# Patient Record
Sex: Female | Born: 1950
Health system: Southern US, Community
[De-identification: ages and names within clinical notes are randomized; demographics above are authoritative.]

## PROBLEM LIST (undated history)

## (undated) DIAGNOSIS — I10 Essential (primary) hypertension: Secondary | ICD-10-CM

## (undated) DIAGNOSIS — N189 Chronic kidney disease, unspecified: Secondary | ICD-10-CM

## (undated) DIAGNOSIS — K219 Gastro-esophageal reflux disease without esophagitis: Secondary | ICD-10-CM

## (undated) HISTORY — PX: TUBAL LIGATION: SHX77

## (undated) HISTORY — PX: OTHER SURGICAL HISTORY: SHX169

---

## 2017-09-22 DIAGNOSIS — Z23 Encounter for immunization: Secondary | ICD-10-CM | POA: Diagnosis not present

## 2017-09-22 DIAGNOSIS — N3281 Overactive bladder: Secondary | ICD-10-CM | POA: Diagnosis not present

## 2017-09-22 DIAGNOSIS — M545 Low back pain: Secondary | ICD-10-CM | POA: Diagnosis not present

## 2017-09-22 DIAGNOSIS — K219 Gastro-esophageal reflux disease without esophagitis: Secondary | ICD-10-CM | POA: Diagnosis not present

## 2017-09-22 DIAGNOSIS — N183 Chronic kidney disease, stage 3 (moderate): Secondary | ICD-10-CM | POA: Diagnosis not present

## 2017-09-22 DIAGNOSIS — M67449 Ganglion, unspecified hand: Secondary | ICD-10-CM | POA: Diagnosis not present

## 2017-09-22 DIAGNOSIS — I10 Essential (primary) hypertension: Secondary | ICD-10-CM | POA: Diagnosis not present

## 2017-10-08 DIAGNOSIS — M79644 Pain in right finger(s): Secondary | ICD-10-CM | POA: Diagnosis not present

## 2017-10-08 DIAGNOSIS — M67441 Ganglion, right hand: Secondary | ICD-10-CM | POA: Diagnosis not present

## 2017-10-08 DIAGNOSIS — M19041 Primary osteoarthritis, right hand: Secondary | ICD-10-CM | POA: Diagnosis not present

## 2018-01-06 ENCOUNTER — Other Ambulatory Visit: Payer: Self-pay | Admitting: Orthopedic Surgery

## 2018-01-14 ENCOUNTER — Other Ambulatory Visit: Payer: Self-pay

## 2018-01-14 ENCOUNTER — Encounter (HOSPITAL_BASED_OUTPATIENT_CLINIC_OR_DEPARTMENT_OTHER): Payer: Self-pay | Admitting: *Deleted

## 2018-01-14 NOTE — Progress Notes (Signed)
Pt is coming Friday for BMET and EKG. Bring all medications.

## 2018-01-16 ENCOUNTER — Encounter (HOSPITAL_BASED_OUTPATIENT_CLINIC_OR_DEPARTMENT_OTHER)
Admission: RE | Admit: 2018-01-16 | Discharge: 2018-01-16 | Disposition: A | Discharge status: Home / Self Care | Payer: Medicare Other | Source: Ambulatory Visit | Attending: Orthopedic Surgery | Admitting: Orthopedic Surgery

## 2018-01-16 ENCOUNTER — Other Ambulatory Visit: Payer: Self-pay

## 2018-01-16 DIAGNOSIS — Z01818 Encounter for other preprocedural examination: Secondary | ICD-10-CM | POA: Diagnosis not present

## 2018-01-16 DIAGNOSIS — I10 Essential (primary) hypertension: Secondary | ICD-10-CM | POA: Insufficient documentation

## 2018-01-16 LAB — BASIC METABOLIC PANEL
ANION GAP: 10 (ref 5–15)
BUN: 14 mg/dL (ref 6–20)
CHLORIDE: 105 mmol/L (ref 101–111)
CO2: 23 mmol/L (ref 22–32)
CREATININE: 1.27 mg/dL — AB (ref 0.44–1.00)
Calcium: 9.3 mg/dL (ref 8.9–10.3)
GFR calc non Af Amer: 43 mL/min — ABNORMAL LOW (ref >60)
GFR, EST AFRICAN AMERICAN: 50 mL/min — AB (ref >60)
GLUCOSE: 100 mg/dL — AB (ref 65–99)
Potassium: 4 mmol/L (ref 3.5–5.1)
Sodium: 138 mmol/L (ref 135–145)

## 2018-01-22 ENCOUNTER — Ambulatory Visit (HOSPITAL_BASED_OUTPATIENT_CLINIC_OR_DEPARTMENT_OTHER): Payer: Medicare Other | Admitting: Anesthesiology

## 2018-01-22 ENCOUNTER — Encounter (HOSPITAL_BASED_OUTPATIENT_CLINIC_OR_DEPARTMENT_OTHER): Payer: Self-pay | Admitting: Orthopedic Surgery

## 2018-01-22 ENCOUNTER — Encounter (HOSPITAL_BASED_OUTPATIENT_CLINIC_OR_DEPARTMENT_OTHER)
Admission: RE | Disposition: A | Discharge status: Home / Self Care | Payer: Self-pay | Source: Ambulatory Visit | Attending: Orthopedic Surgery

## 2018-01-22 ENCOUNTER — Ambulatory Visit (HOSPITAL_BASED_OUTPATIENT_CLINIC_OR_DEPARTMENT_OTHER)
Admission: RE | Admit: 2018-01-22 | Discharge: 2018-01-22 | Disposition: A | Discharge status: Home / Self Care | Payer: Medicare Other | Source: Ambulatory Visit | Attending: Orthopedic Surgery | Admitting: Orthopedic Surgery

## 2018-01-22 ENCOUNTER — Other Ambulatory Visit: Payer: Self-pay

## 2018-01-22 DIAGNOSIS — I129 Hypertensive chronic kidney disease with stage 1 through stage 4 chronic kidney disease, or unspecified chronic kidney disease: Secondary | ICD-10-CM | POA: Insufficient documentation

## 2018-01-22 DIAGNOSIS — N189 Chronic kidney disease, unspecified: Secondary | ICD-10-CM | POA: Insufficient documentation

## 2018-01-22 DIAGNOSIS — M25741 Osteophyte, right hand: Secondary | ICD-10-CM | POA: Insufficient documentation

## 2018-01-22 DIAGNOSIS — M67441 Ganglion, right hand: Secondary | ICD-10-CM | POA: Insufficient documentation

## 2018-01-22 DIAGNOSIS — M19041 Primary osteoarthritis, right hand: Secondary | ICD-10-CM | POA: Diagnosis not present

## 2018-01-22 DIAGNOSIS — M6740 Ganglion, unspecified site: Secondary | ICD-10-CM | POA: Diagnosis not present

## 2018-01-22 HISTORY — DX: Essential (primary) hypertension: I10

## 2018-01-22 HISTORY — DX: Chronic kidney disease, unspecified: N18.9

## 2018-01-22 HISTORY — PX: MASS EXCISION: SHX2000

## 2018-01-22 HISTORY — DX: Gastro-esophageal reflux disease without esophagitis: K21.9

## 2018-01-22 SURGERY — EXCISION MASS
Anesthesia: Regional | Site: Thumb | Laterality: Right

## 2018-01-22 MED ORDER — FENTANYL CITRATE (PF) 100 MCG/2ML IJ SOLN
INTRAMUSCULAR | Status: AC
  Filled 2018-01-22: qty 2

## 2018-01-22 MED ORDER — SCOPOLAMINE 1 MG/3DAYS TD PT72: 1.0000 | MEDICATED_PATCH | Freq: Once | TRANSDERMAL | Status: DC | PRN

## 2018-01-22 MED ORDER — ONDANSETRON HCL 4 MG/2ML IJ SOLN
INTRAMUSCULAR | Status: DC | PRN
  Administered 2018-01-22: 4 mg via INTRAVENOUS

## 2018-01-22 MED ORDER — CHLORHEXIDINE GLUCONATE 4 % EX LIQD: 60.0000 mL | Freq: Once | CUTANEOUS | Status: DC

## 2018-01-22 MED ORDER — PROPOFOL 10 MG/ML IV BOLUS
INTRAVENOUS | Status: DC | PRN
  Administered 2018-01-22: 170 mg via INTRAVENOUS

## 2018-01-22 MED ORDER — HYDROMORPHONE HCL 1 MG/ML IJ SOLN: 0.2500 mg | INTRAMUSCULAR | Status: DC | PRN

## 2018-01-22 MED ORDER — FENTANYL CITRATE (PF) 100 MCG/2ML IJ SOLN
INTRAMUSCULAR | Status: DC | PRN
  Administered 2018-01-22: 50 ug via INTRAVENOUS

## 2018-01-22 MED ORDER — EPHEDRINE SULFATE-NACL 50-0.9 MG/10ML-% IV SOSY
PREFILLED_SYRINGE | INTRAVENOUS | Status: DC | PRN
  Administered 2018-01-22: 10 mg via INTRAVENOUS

## 2018-01-22 MED ORDER — HYDROCODONE-ACETAMINOPHEN 7.5-325 MG PO TABS: 1.0000 | ORAL_TABLET | Freq: Once | ORAL | Status: DC | PRN

## 2018-01-22 MED ORDER — HYDROCODONE-ACETAMINOPHEN 5-325 MG PO TABS: ORAL_TABLET | ORAL | 0 refills | Status: AC

## 2018-01-22 MED ORDER — MEPERIDINE HCL 25 MG/ML IJ SOLN: 6.2500 mg | INTRAMUSCULAR | Status: DC | PRN

## 2018-01-22 MED ORDER — CEFAZOLIN SODIUM-DEXTROSE 2-4 GM/100ML-% IV SOLN
2.0000 g | INTRAVENOUS | Status: AC
  Administered 2018-01-22: 2 g via INTRAVENOUS

## 2018-01-22 MED ORDER — FENTANYL CITRATE (PF) 100 MCG/2ML IJ SOLN: 50.0000 ug | INTRAMUSCULAR | Status: DC | PRN

## 2018-01-22 MED ORDER — ACETAMINOPHEN 10 MG/ML IV SOLN: 1000.0000 mg | Freq: Once | INTRAVENOUS | Status: DC | PRN

## 2018-01-22 MED ORDER — MIDAZOLAM HCL 2 MG/2ML IJ SOLN
INTRAMUSCULAR | Status: AC
Start: 2018-01-22 — End: 2018-01-22
  Filled 2018-01-22: qty 2

## 2018-01-22 MED ORDER — MIDAZOLAM HCL 2 MG/2ML IJ SOLN: 1.0000 mg | INTRAMUSCULAR | Status: DC | PRN

## 2018-01-22 MED ORDER — LIDOCAINE 2% (20 MG/ML) 5 ML SYRINGE
INTRAMUSCULAR | Status: DC | PRN
Start: 2018-01-22 — End: 2018-01-22
  Administered 2018-01-22: 80 mg via INTRAVENOUS

## 2018-01-22 MED ORDER — PROMETHAZINE HCL 25 MG/ML IJ SOLN: 6.2500 mg | INTRAMUSCULAR | Status: DC | PRN

## 2018-01-22 MED ORDER — BUPIVACAINE HCL (PF) 0.25 % IJ SOLN
INTRAMUSCULAR | Status: DC | PRN
  Administered 2018-01-22: 5 mL

## 2018-01-22 MED ORDER — LACTATED RINGERS IV SOLN
INTRAVENOUS | Status: DC
  Administered 2018-01-22: 12:00:00 via INTRAVENOUS

## 2018-01-22 MED ORDER — CEFAZOLIN SODIUM-DEXTROSE 2-4 GM/100ML-% IV SOLN
INTRAVENOUS | Status: AC
  Filled 2018-01-22: qty 100

## 2018-01-22 MED ORDER — DEXAMETHASONE SODIUM PHOSPHATE 10 MG/ML IJ SOLN
INTRAMUSCULAR | Status: DC | PRN
  Administered 2018-01-22: 10 mg via INTRAVENOUS

## 2018-01-22 MED ORDER — MIDAZOLAM HCL 5 MG/5ML IJ SOLN
INTRAMUSCULAR | Status: DC | PRN
  Administered 2018-01-22: 2 mg via INTRAVENOUS

## 2018-01-22 MED ORDER — PROPOFOL 10 MG/ML IV BOLUS
INTRAVENOUS | Status: AC
  Filled 2018-01-22: qty 20

## 2018-01-22 SURGICAL SUPPLY — 50 items
BANDAGE ACE 3X5.8 VEL STRL LF (GAUZE/BANDAGES/DRESSINGS) IMPLANT
BANDAGE COBAN STERILE 2 (GAUZE/BANDAGES/DRESSINGS) IMPLANT
BENZOIN TINCTURE PRP APPL 2/3 (GAUZE/BANDAGES/DRESSINGS) IMPLANT
BLADE MINI RND TIP GREEN BEAV (BLADE) IMPLANT
BLADE SURG 15 STRL LF DISP TIS (BLADE) ×2 IMPLANT
BLADE SURG 15 STRL SS (BLADE) ×4
BNDG COHESIVE 1X5 TAN STRL LF (GAUZE/BANDAGES/DRESSINGS) IMPLANT
BNDG CONFORM 2 STRL LF (GAUZE/BANDAGES/DRESSINGS) IMPLANT
BNDG ELASTIC 2X5.8 VLCR STR LF (GAUZE/BANDAGES/DRESSINGS) IMPLANT
BNDG ESMARK 4X9 LF (GAUZE/BANDAGES/DRESSINGS) ×3 IMPLANT
BNDG GAUZE 1X2.1 STRL (MISCELLANEOUS) IMPLANT
BNDG GAUZE ELAST 4 BULKY (GAUZE/BANDAGES/DRESSINGS) IMPLANT
BNDG PLASTER X FAST 3X3 WHT LF (CAST SUPPLIES) IMPLANT
CHLORAPREP W/TINT 26ML (MISCELLANEOUS) ×3 IMPLANT
CLOSURE WOUND 1/2 X4 (GAUZE/BANDAGES/DRESSINGS)
CORD BIPOLAR FORCEPS 12FT (ELECTRODE) ×3 IMPLANT
COVER BACK TABLE 60X90IN (DRAPES) ×3 IMPLANT
COVER MAYO STAND STRL (DRAPES) ×3 IMPLANT
CUFF TOURNIQUET SINGLE 18IN (TOURNIQUET CUFF) ×3 IMPLANT
DRAPE EXTREMITY T 121X128X90 (DRAPE) ×3 IMPLANT
DRAPE SURG 17X23 STRL (DRAPES) ×3 IMPLANT
GAUZE SPONGE 4X4 12PLY STRL (GAUZE/BANDAGES/DRESSINGS) ×3 IMPLANT
GAUZE XEROFORM 1X8 LF (GAUZE/BANDAGES/DRESSINGS) ×3 IMPLANT
GLOVE BIO SURGEON STRL SZ7.5 (GLOVE) ×3 IMPLANT
GLOVE BIOGEL PI IND STRL 8 (GLOVE) ×1 IMPLANT
GLOVE BIOGEL PI INDICATOR 8 (GLOVE) ×2
GOWN STRL REUS W/ TWL LRG LVL3 (GOWN DISPOSABLE) ×1 IMPLANT
GOWN STRL REUS W/TWL LRG LVL3 (GOWN DISPOSABLE) ×2
GOWN STRL REUS W/TWL XL LVL3 (GOWN DISPOSABLE) ×3 IMPLANT
NEEDLE HYPO 25X1 1.5 SAFETY (NEEDLE) ×3 IMPLANT
NS IRRIG 1000ML POUR BTL (IV SOLUTION) ×3 IMPLANT
PACK BASIN DAY SURGERY FS (CUSTOM PROCEDURE TRAY) ×3 IMPLANT
PAD CAST 3X4 CTTN HI CHSV (CAST SUPPLIES) IMPLANT
PAD CAST 4YDX4 CTTN HI CHSV (CAST SUPPLIES) IMPLANT
PADDING CAST ABS 4INX4YD NS (CAST SUPPLIES) ×2
PADDING CAST ABS COTTON 4X4 ST (CAST SUPPLIES) ×1 IMPLANT
PADDING CAST COTTON 3X4 STRL (CAST SUPPLIES)
PADDING CAST COTTON 4X4 STRL (CAST SUPPLIES)
SPLINT FINGER 3.25 BULB 911905 (SOFTGOODS) ×3 IMPLANT
STOCKINETTE 4X48 STRL (DRAPES) ×3 IMPLANT
STRIP CLOSURE SKIN 1/2X4 (GAUZE/BANDAGES/DRESSINGS) IMPLANT
SUT ETHILON 3 0 PS 1 (SUTURE) IMPLANT
SUT ETHILON 4 0 PS 2 18 (SUTURE) ×3 IMPLANT
SUT ETHILON 5 0 P 3 18 (SUTURE)
SUT NYLON ETHILON 5-0 P-3 1X18 (SUTURE) IMPLANT
SUT VIC AB 4-0 P2 18 (SUTURE) IMPLANT
SYR BULB 3OZ (MISCELLANEOUS) ×3 IMPLANT
SYR CONTROL 10ML LL (SYRINGE) ×3 IMPLANT
TOWEL OR 17X24 6PK STRL BLUE (TOWEL DISPOSABLE) ×6 IMPLANT
UNDERPAD 30X30 (UNDERPADS AND DIAPERS) ×3 IMPLANT

## 2018-01-22 NOTE — Transfer of Care (Signed)
Immediate Anesthesia Transfer of Care Note  Patient: Caroline Best  Procedure(s) Performed: EXCISION MUCOID CYST RIGHT THUMB (Right Thumb)  Patient Location: PACU  Anesthesia Type:General  Level of Consciousness: awake, alert  and oriented  Airway & Oxygen Therapy: Patient Spontanous Breathing and Patient connected to face mask oxygen  Post-op Assessment: Report given to RN and Post -op Vital signs reviewed and stable  Post vital signs: Reviewed and stable  Last Vitals:  Vitals Value Taken Time  BP 106/65 01/22/2018  1:45 PM  Temp    Pulse 89 01/22/2018  1:46 PM  Resp 17 01/22/2018  1:46 PM  SpO2 99 % 01/22/2018  1:46 PM  Vitals shown include unvalidated device data.  Last Pain:  Vitals:   01/22/18 1122  TempSrc: Oral         Complications: No apparent anesthesia complications

## 2018-01-22 NOTE — Anesthesia Preprocedure Evaluation (Addendum)
Anesthesia Evaluation  Patient identified by MRN, date of birth, ID band Patient awake    Reviewed: Allergy & Precautions, NPO status , Patient's Chart, lab work & pertinent test results  Airway Mallampati: II  TM Distance: >3 FB Neck ROM: Full    Dental no notable dental hx.    Pulmonary neg pulmonary ROS,    Pulmonary exam normal breath sounds clear to auscultation       Cardiovascular hypertension, Normal cardiovascular exam Rhythm:Regular Rate:Normal     Neuro/Psych negative neurological ROS     GI/Hepatic Neg liver ROS,   Endo/Other    Renal/GU      Musculoskeletal   Abdominal (+) + obese,   Peds  Hematology   Anesthesia Other Findings   Reproductive/Obstetrics                            Anesthesia Physical Anesthesia Plan  ASA: II  Anesthesia Plan: General   Post-op Pain Management: GA combined w/ Regional for post-op pain   Induction: Intravenous  PONV Risk Score and Plan: 3 and Treatment may vary due to age or medical condition, Dexamethasone and Ondansetron  Airway Management Planned: LMA  Additional Equipment:   Intra-op Plan:   Post-operative Plan:   Informed Consent: I have reviewed the patients History and Physical, chart, labs and discussed the procedure including the risks, benefits and alternatives for the proposed anesthesia with the patient or authorized representative who has indicated his/her understanding and acceptance.     Plan Discussed with: CRNA  Anesthesia Plan Comments:        Anesthesia Quick Evaluation

## 2018-01-22 NOTE — Brief Op Note (Signed)
01/22/2018  1:42 PM  PATIENT:  Caroline EwingsLillie Best  67 y.o. female  PRE-OPERATIVE DIAGNOSIS:  right thumb mucoid cyst M67.40  POST-OPERATIVE DIAGNOSIS:  right thumb mucoid cyst M67.40  PROCEDURE:  Procedure(s): EXCISION MUCOID CYST RIGHT THUMB (Right)  SURGEON:  Surgeon(s) and Role:    Betha Loa* Chace Bisch, MD - Primary  PHYSICIAN ASSISTANT:   ASSISTANTS: none   ANESTHESIA:   general  EBL:  10 mL   BLOOD ADMINISTERED:none  DRAINS: none   LOCAL MEDICATIONS USED:  MARCAINE     SPECIMEN:  Source of Specimen:  right thumb  DISPOSITION OF SPECIMEN:  PATHOLOGY  COUNTS:  YES  TOURNIQUET:   Total Tourniquet Time Documented: Upper Arm (Right) - 20 minutes Total: Upper Arm (Right) - 20 minutes   DICTATION: .Other Dictation: Dictation Number [id number]  PLAN OF CARE: Discharge to home after PACU  PATIENT DISPOSITION:  PACU - hemodynamically stable.

## 2018-01-22 NOTE — H&P (Signed)
  Caroline EwingsLillie Best is an 67 y.o. female.   Chief Complaint: right thumb mucoid cyst HPI: 67 yo female with mass right thumb x 4 weeks.  It is bothersome to her.  She wishes to have it removed and the ip joint debrided.  Allergies: No Known Allergies  Past Medical History:  Diagnosis Date  . Chronic kidney disease    urinary incontinence  . GERD (gastroesophageal reflux disease)   . Hypertension     Past Surgical History:  Procedure Laterality Date  . Removal of hernia disc and plate placed     in neck  . right foot surgery    . TUBAL LIGATION      Family History: No family history on file.  Social History:   reports that she has never smoked. She has never used smokeless tobacco. She reports that she drinks alcohol. She reports that she does not use drugs.  Medications: No medications prior to admission.    No results found for this or any previous visit (from the past 48 hour(s)).  No results found.   A comprehensive review of systems was negative.  Height 5\' 6"  (1.676 m), weight 102.1 kg (225 lb).  General appearance: alert, cooperative and appears stated age Head: Normocephalic, without obvious abnormality, atraumatic Neck: supple, symmetrical, trachea midline Cardio: regular rate and rhythm Resp: clear to auscultation bilaterally Extremities: Intact sensation and capillary refill all digits.  +epl/fpl/io.  No wounds.  Pulses: 2+ and symmetric Skin: Skin color, texture, turgor normal. No rashes or lesions Neurologic: Grossly normal Incision/Wound: none  Assessment/Plan Right thumb mucoid cyst.  Non operative and operative treatment options were discussed with the patient and patient wishes to proceed with operative treatment. Risks, benefits, and alternatives of surgery were discussed and the patient agrees with the plan of care.   Maurizio Geno R 01/22/2018, 10:12 AM

## 2018-01-22 NOTE — Discharge Instructions (Addendum)

## 2018-01-22 NOTE — Anesthesia Postprocedure Evaluation (Signed)
Anesthesia Post Note  Patient: Caroline Best  Procedure(s) Performed: EXCISION MUCOID CYST RIGHT THUMB (Right Thumb)     Patient location during evaluation: PACU Anesthesia Type: General Level of consciousness: awake and alert Pain management: pain level controlled Vital Signs Assessment: post-procedure vital signs reviewed and stable Respiratory status: spontaneous breathing, nonlabored ventilation, respiratory function stable and patient connected to nasal cannula oxygen Cardiovascular status: blood pressure returned to baseline and stable Postop Assessment: no apparent nausea or vomiting Anesthetic complications: no    Last Vitals:  Vitals Value Taken Time  BP    Temp    Pulse    Resp    SpO2      Last Pain:  Vitals:   01/22/18 1122  TempSrc: Oral                 Trevor IhaStephen A Samin Milke

## 2018-01-22 NOTE — Op Note (Signed)
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  PROCEDURE:   1. Right thumb excision of mucoid cyst  2. Right thumb debridement of IP joint including removal of osteophytes from proximal phalanx.  SURGEON:  Caroline LoaKevin Caroline Eve, MD.  ASSISTANT:  None.  ANESTHESIA:  General.  IV FLUIDS:  Per anesthesia flow sheet.  ESTIMATED BLOOD LOSS:  Minimal.  COMPLICATIONS:  None.  SPECIMENS:  Mucoid cyst to Pathology.  TOURNIQUET TIME:  20 minutes.  DISPOSITION:  Stable to PACU.  INDICATIONS:  Caroline Best is a 67 year old female, who has noted a mass on the right thumb.  This is bothersome to her.  She wished to have it removed.  Risks, benefits, and alternatives of surgery were discussed including the risk of blood loss; infection; damage to nerves, vessels, tendons, ligaments, bone; failure of surgery; need for additional surgery; complications with wound healing; continued pain; recurrence of mass.  She voiced understanding of these risks and elected to proceed.  OPERATIVE COURSE:  After being identified preoperatively by myself, the patient and I agreed upon procedure and site of procedure.  Surgical site was marked.  Risks, benefits, and alternatives of surgery were reviewed and she wished to proceed.  Surgical consent had been signed. She was given IV Ancef as preoperative antibiotic prophylaxis.  She was transferred to the operating room and placed on operating room table in supine position with the right upper extremity on arm board.   General anesthesia was induced by anesthesiologist.  Right upper extremity was prepped and draped in normal sterile orthopedic fashion.  Surgical pause was performed between surgeons, Anesthesia, and operating room staff; and all were in agreement as to the patient, procedure, and site of procedure.  Tourniquet at the proximal aspect of the extremity was inflated to 250 mmHg after exsanguination of the limb with an Esmarch bandage.  A hockey stick-shaped incision was made at the dorsum of the IP joint of the right thumb.  This was carried into subcutaneous tissues by spreading technique.  The mass was easily identified.  It was cystic in nature.  It was filled with gelatinous fluid with some bloody tinge to it.  The cystic sac was removed and sent to Pathology for examination.  The joint was entered underneath the EPL tendon.  There was prominent osteophyte at the radial aspect dorsally on the proximal phalanx.  The rongeurs were used to debride the joint of any remaining cyst as well as the prominent osteophyte.  The wound was copiously irrigated with sterile saline and closed with 4-0 nylon in a horizontal mattress fashion.  A digital block was performed with 0.25% plain Marcaine to aid in postoperative analgesia.  The wound was dressed with sterile Xeroform, 4x4s, and wrapped with a Coban dressing lightly. Alumafoam splint was placed and wrapped lightly with Coban dressing. Tourniquet was deflated at 20 minutes.  Fingertips were pink with brisk capillary refill after deflation of tourniquet.  The operative drapes were broken down.  The patient was awoken from anesthesia safely.  She was transferred back to stretcher and taken to PACU in stable condition. I will see her back in the office in 1 week for postoperative followup. I will give her Norco 5/325 one to two p.o. q.6 hours p.r.n. pain, dispensed #20.Marland Kitchen     Caroline Loa, MD     KK/MEDQ  D:  01/22/2018  T:  01/22/2018  Job:   [id number]

## 2018-01-22 NOTE — Anesthesia Procedure Notes (Signed)
Procedure Name: LMA Insertion Date/Time: 01/22/2018 1:06 PM Performed by: Marny Lowensteinapozzi, Yvonna Brun W, CRNA Pre-anesthesia Checklist: Patient identified, Suction available, Patient being monitored, Emergency Drugs available and Timeout performed Patient Re-evaluated:Patient Re-evaluated prior to induction Oxygen Delivery Method: Circle system utilized Preoxygenation: Pre-oxygenation with 100% oxygen Induction Type: IV induction Ventilation: Mask ventilation without difficulty LMA: LMA inserted LMA Size: 4.0 Number of attempts: 1 Placement Confirmation: positive ETCO2,  CO2 detector and breath sounds checked- equal and bilateral Tube secured with: Tape Dental Injury: Teeth and Oropharynx as per pre-operative assessment

## 2018-01-22 NOTE — Op Note (Signed)
[id number] 

## 2018-01-22 NOTE — Anesthesia Postprocedure Evaluation (Signed)
Anesthesia Post Note  Patient: Caroline EwingsLillie Best  Procedure(s) Performed: EXCISION MUCOID CYST RIGHT THUMB (Right Thumb)     Anesthesia Type: General    Last Vitals:  Vitals Value Taken Time  BP    Temp    Pulse    Resp    SpO2      Last Pain:  Vitals:   01/22/18 1122  TempSrc: Oral                 Trevor IhaStephen A Echo Propp

## 2018-01-23 ENCOUNTER — Encounter (HOSPITAL_BASED_OUTPATIENT_CLINIC_OR_DEPARTMENT_OTHER): Payer: Self-pay | Admitting: Orthopedic Surgery

## 2018-01-23 NOTE — Addendum Note (Signed)
Addendum  created 01/23/18 1048 by Trevor IhaHouser, Stephen A, MD   Sign clinical note

## 2018-01-23 NOTE — Anesthesia Postprocedure Evaluation (Signed)
Anesthesia Post Note  Patient: Caroline EwingsLillie Best  Procedure(s) Performed: EXCISION MUCOID CYST RIGHT THUMB (Right Thumb)     Patient location during evaluation: PACU Anesthesia Type: General Level of consciousness: awake and alert Pain management: pain level controlled Vital Signs Assessment: post-procedure vital signs reviewed and stable Respiratory status: spontaneous breathing, nonlabored ventilation, respiratory function stable and patient connected to nasal cannula oxygen Cardiovascular status: blood pressure returned to baseline and stable Postop Assessment: no apparent nausea or vomiting Anesthetic complications: no    Last Vitals:  Vitals:   01/22/18 1422 01/22/18 1435  BP:  116/65  Pulse: 66 68  Resp: 15 18  Temp:  36.6 C  SpO2: 97% 98%    Last Pain:  Vitals:   01/22/18 1122  TempSrc: Oral   Pain Goal:                 Trevor IhaStephen A Janmarie Smoot

## 2018-01-23 NOTE — Addendum Note (Signed)
Addendum  created 01/23/18 1126 by Saranya Harlin, Jewel Baizeimothy D, CRNA   Charge Capture section accepted

## 2018-04-01 ENCOUNTER — Other Ambulatory Visit: Payer: Self-pay | Admitting: Nurse Practitioner

## 2018-04-01 DIAGNOSIS — N183 Chronic kidney disease, stage 3 (moderate): Secondary | ICD-10-CM | POA: Diagnosis not present

## 2018-04-01 DIAGNOSIS — M545 Low back pain: Secondary | ICD-10-CM | POA: Diagnosis not present

## 2018-04-01 DIAGNOSIS — Z1159 Encounter for screening for other viral diseases: Secondary | ICD-10-CM | POA: Diagnosis not present

## 2018-04-01 DIAGNOSIS — K219 Gastro-esophageal reflux disease without esophagitis: Secondary | ICD-10-CM | POA: Diagnosis not present

## 2018-04-01 DIAGNOSIS — E782 Mixed hyperlipidemia: Secondary | ICD-10-CM | POA: Diagnosis not present

## 2018-04-01 DIAGNOSIS — Z1231 Encounter for screening mammogram for malignant neoplasm of breast: Secondary | ICD-10-CM | POA: Diagnosis not present

## 2018-04-01 DIAGNOSIS — I10 Essential (primary) hypertension: Secondary | ICD-10-CM | POA: Diagnosis not present

## 2018-04-01 DIAGNOSIS — N3281 Overactive bladder: Secondary | ICD-10-CM | POA: Diagnosis not present

## 2018-04-01 DIAGNOSIS — Z1211 Encounter for screening for malignant neoplasm of colon: Secondary | ICD-10-CM | POA: Diagnosis not present

## 2018-04-22 ENCOUNTER — Ambulatory Visit: Payer: Medicare Other

## 2018-06-10 ENCOUNTER — Ambulatory Visit
Admission: RE | Admit: 2018-06-10 | Discharge: 2018-06-10 | Disposition: A | Discharge status: Home / Self Care | Payer: Medicare Other | Source: Ambulatory Visit | Attending: Nurse Practitioner | Admitting: Nurse Practitioner

## 2018-06-10 DIAGNOSIS — Z1231 Encounter for screening mammogram for malignant neoplasm of breast: Secondary | ICD-10-CM | POA: Diagnosis not present

## 2018-09-23 DIAGNOSIS — E782 Mixed hyperlipidemia: Secondary | ICD-10-CM | POA: Diagnosis not present

## 2018-09-23 DIAGNOSIS — I10 Essential (primary) hypertension: Secondary | ICD-10-CM | POA: Diagnosis not present

## 2018-09-23 DIAGNOSIS — N183 Chronic kidney disease, stage 3 (moderate): Secondary | ICD-10-CM | POA: Diagnosis not present

## 2019-04-16 DIAGNOSIS — K219 Gastro-esophageal reflux disease without esophagitis: Secondary | ICD-10-CM | POA: Diagnosis not present

## 2019-04-16 DIAGNOSIS — N183 Chronic kidney disease, stage 3 (moderate): Secondary | ICD-10-CM | POA: Diagnosis not present

## 2019-04-16 DIAGNOSIS — E782 Mixed hyperlipidemia: Secondary | ICD-10-CM | POA: Diagnosis not present

## 2019-04-16 DIAGNOSIS — M545 Low back pain: Secondary | ICD-10-CM | POA: Diagnosis not present

## 2019-04-16 DIAGNOSIS — N3281 Overactive bladder: Secondary | ICD-10-CM | POA: Diagnosis not present

## 2019-04-16 DIAGNOSIS — Z1211 Encounter for screening for malignant neoplasm of colon: Secondary | ICD-10-CM | POA: Diagnosis not present

## 2019-04-16 DIAGNOSIS — Z0000 Encounter for general adult medical examination without abnormal findings: Secondary | ICD-10-CM | POA: Diagnosis not present

## 2019-04-16 DIAGNOSIS — Z1239 Encounter for other screening for malignant neoplasm of breast: Secondary | ICD-10-CM | POA: Diagnosis not present

## 2019-04-16 DIAGNOSIS — I10 Essential (primary) hypertension: Secondary | ICD-10-CM | POA: Diagnosis not present

## 2019-05-19 DIAGNOSIS — E782 Mixed hyperlipidemia: Secondary | ICD-10-CM | POA: Diagnosis not present

## 2019-05-19 DIAGNOSIS — I10 Essential (primary) hypertension: Secondary | ICD-10-CM | POA: Diagnosis not present

## 2019-05-27 IMAGING — MG DIGITAL SCREENING BILATERAL MAMMOGRAM WITH TOMO AND CAD
8 series · 8 of 24 positions shown · non-contrast
Comparison: Previous exam(s).

CLINICAL DATA: Screening.

EXAM:
DIGITAL SCREENING BILATERAL MAMMOGRAM WITH TOMO AND CAD

[R CC synth-2D]
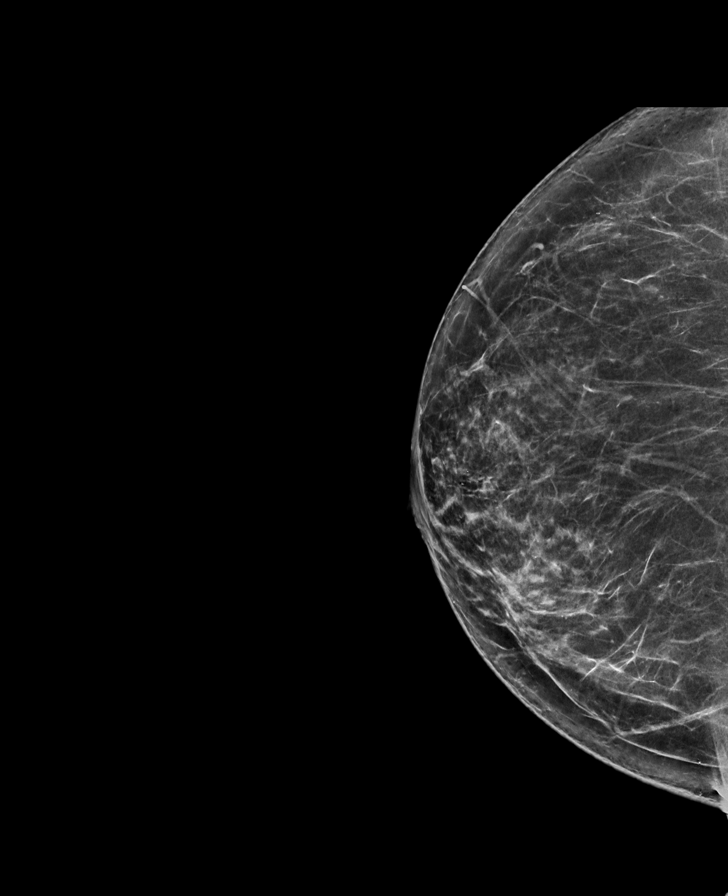

[L MLO synth-2D]
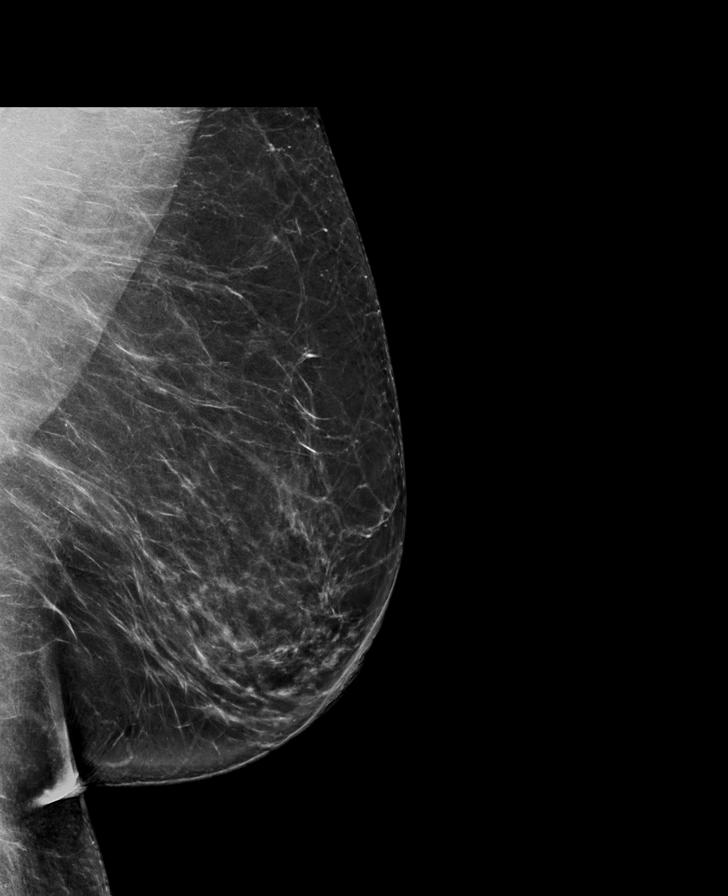

[R MLO synth-2D]
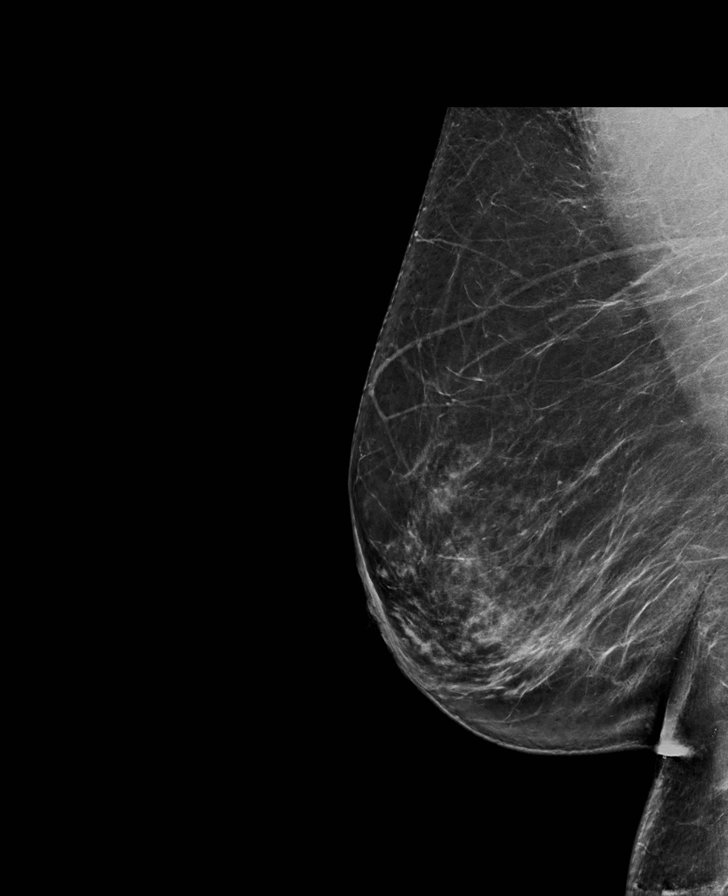

[L CC synth-2D]
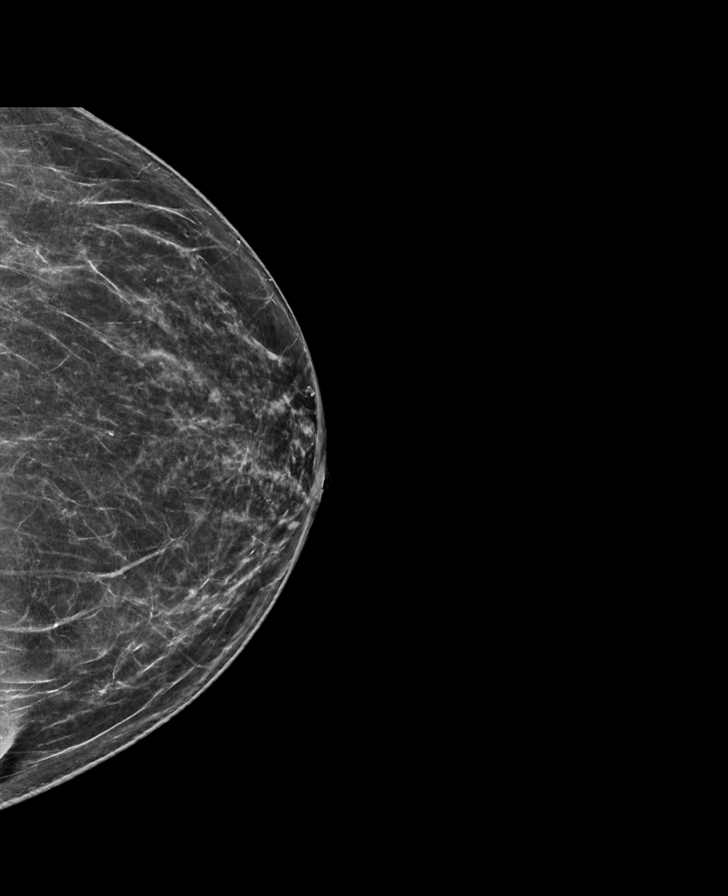

[R CC tomo · tomo slice 39/76.0]
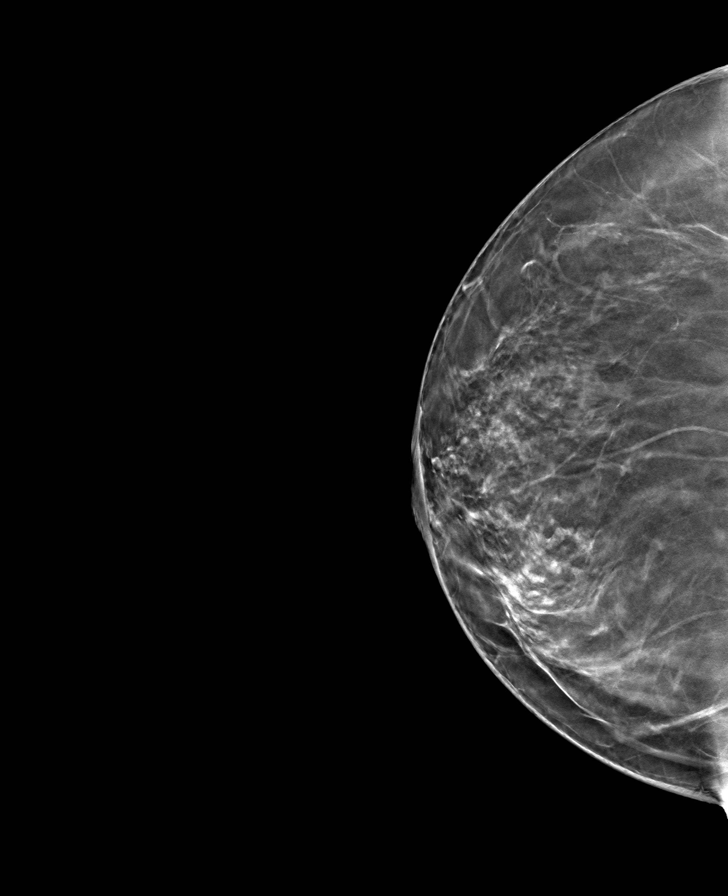

[R MLO tomo · tomo slice 45/88.0]
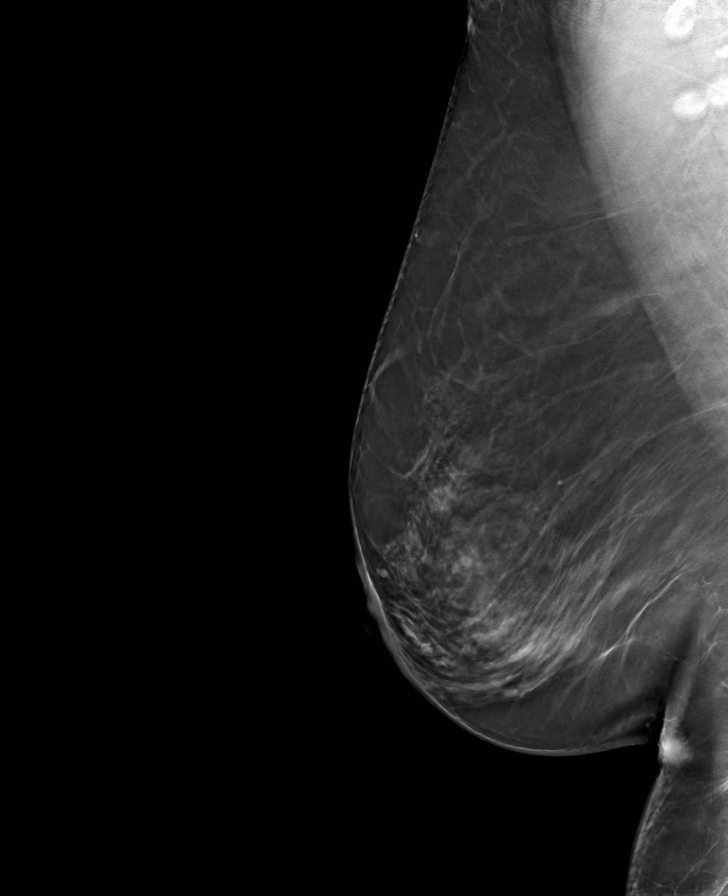

[L MLO tomo · tomo slice 45/90.0]
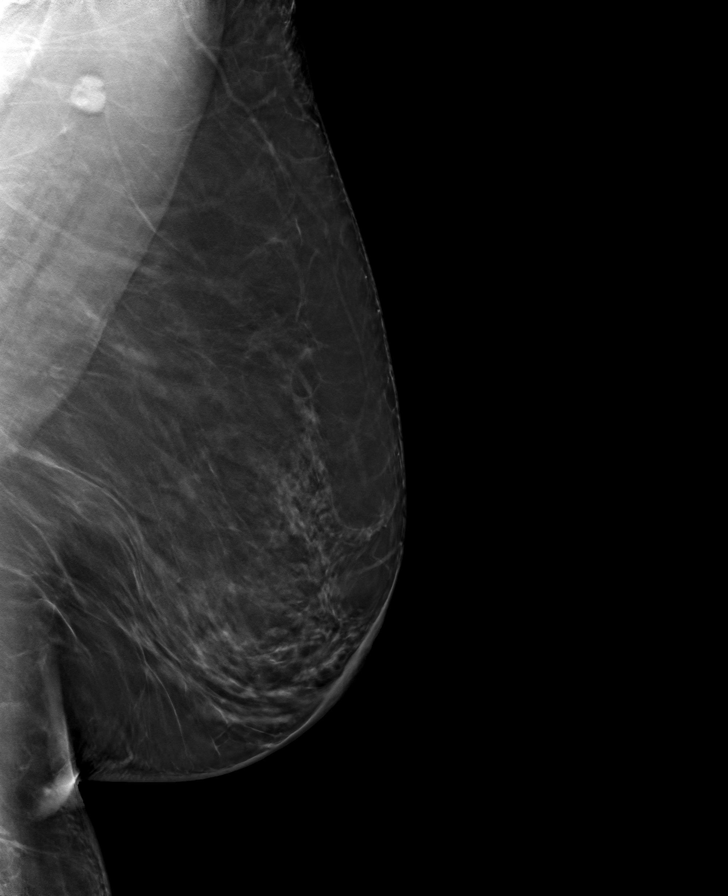

[L CC tomo · tomo slice 38/75.0]
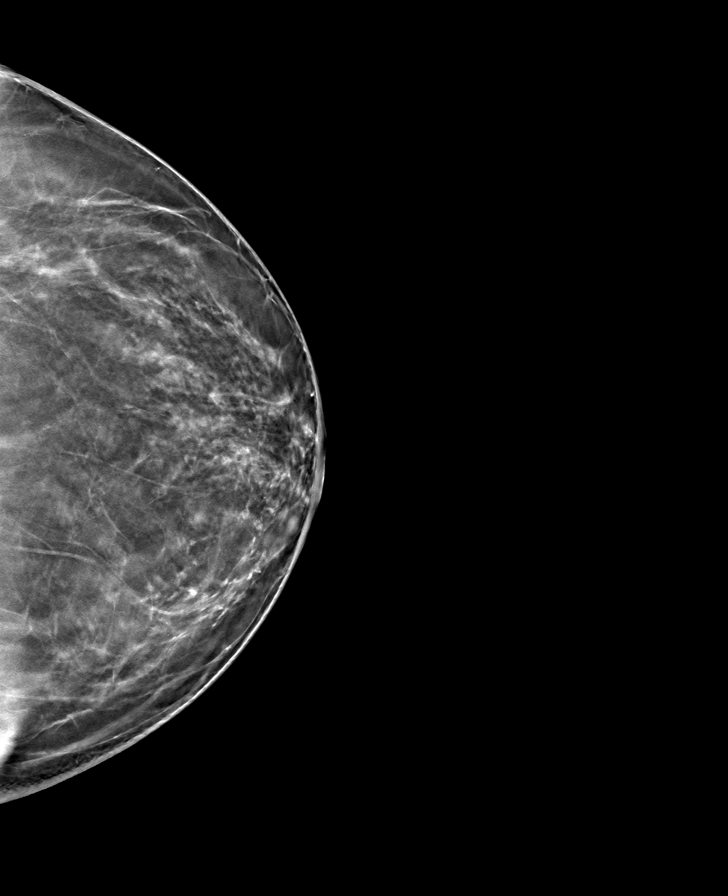

[8 of 24 positions shown; findings below may reference images not displayed]

ACR Breast Density Category b: There are scattered areas of
fibroglandular density.
FINDINGS: There are no findings suspicious for malignancy. Images were
processed with CAD.
IMPRESSION: No mammographic evidence of malignancy. A result letter of this
screening mammogram will be mailed directly to the patient.

RECOMMENDATION:
Screening mammogram in one year. (Code:[id number])

BI-RADS CATEGORY  1: Negative.

## 2019-05-31 ENCOUNTER — Other Ambulatory Visit: Payer: Self-pay | Admitting: Internal Medicine

## 2019-05-31 DIAGNOSIS — Z1231 Encounter for screening mammogram for malignant neoplasm of breast: Secondary | ICD-10-CM

## 2019-07-15 ENCOUNTER — Ambulatory Visit
Admission: RE | Admit: 2019-07-15 | Discharge: 2019-07-15 | Disposition: A | Discharge status: Home / Self Care | Payer: Medicare Other | Source: Ambulatory Visit | Attending: Internal Medicine | Admitting: Internal Medicine

## 2019-07-15 ENCOUNTER — Other Ambulatory Visit: Payer: Self-pay

## 2019-07-15 DIAGNOSIS — Z1231 Encounter for screening mammogram for malignant neoplasm of breast: Secondary | ICD-10-CM

## 2020-02-15 ENCOUNTER — Ambulatory Visit: Payer: Medicare Other | Attending: Internal Medicine

## 2020-02-15 DIAGNOSIS — Z23 Encounter for immunization: Secondary | ICD-10-CM

## 2020-02-15 NOTE — Progress Notes (Signed)
   Covid-19 Vaccination Clinic  Name:  Taimane Stimmel    MRN: [id number] DOB: [birth year]  02/15/2020  Ms. Washington was observed post Covid-19 immunization for 15 minutes without incident. She was provided with Vaccine Information Sheet and instruction to access the V-Safe system.   Ms. Stopher was instructed to call 911 with any severe reactions post vaccine: Marland Kitchen Difficulty breathing  . Swelling of face and throat  . A fast heartbeat  . A bad rash all over body  . Dizziness and weakness   Immunizations Administered    Name Date Dose VIS Date Route   Pfizer COVID-19 Vaccine 02/15/2020 12:51 PM 0.3 mL 10/15/2019 Intramuscular   Manufacturer: [employer], [employer]   Lot: [id number]   NDC: [address]-9

## 2020-06-28 ENCOUNTER — Other Ambulatory Visit: Payer: Self-pay | Admitting: Internal Medicine

## 2020-06-28 DIAGNOSIS — Z1231 Encounter for screening mammogram for malignant neoplasm of breast: Secondary | ICD-10-CM

## 2020-07-18 ENCOUNTER — Other Ambulatory Visit: Payer: Self-pay

## 2020-07-18 ENCOUNTER — Ambulatory Visit
Admission: RE | Admit: 2020-07-18 | Discharge: 2020-07-18 | Disposition: A | Discharge status: Home / Self Care | Payer: Medicare Other | Source: Ambulatory Visit | Attending: Internal Medicine | Admitting: Internal Medicine

## 2020-07-18 DIAGNOSIS — Z1231 Encounter for screening mammogram for malignant neoplasm of breast: Secondary | ICD-10-CM

## 2020-10-09 DIAGNOSIS — G8929 Other chronic pain: Secondary | ICD-10-CM | POA: Diagnosis not present

## 2020-10-09 DIAGNOSIS — K219 Gastro-esophageal reflux disease without esophagitis: Secondary | ICD-10-CM | POA: Diagnosis not present

## 2020-10-09 DIAGNOSIS — M1990 Unspecified osteoarthritis, unspecified site: Secondary | ICD-10-CM | POA: Diagnosis not present

## 2020-10-09 DIAGNOSIS — N183 Chronic kidney disease, stage 3 unspecified: Secondary | ICD-10-CM | POA: Diagnosis not present

## 2020-10-09 DIAGNOSIS — I10 Essential (primary) hypertension: Secondary | ICD-10-CM | POA: Diagnosis not present

## 2020-10-09 DIAGNOSIS — E782 Mixed hyperlipidemia: Secondary | ICD-10-CM | POA: Diagnosis not present

## 2020-11-23 DIAGNOSIS — N183 Chronic kidney disease, stage 3 unspecified: Secondary | ICD-10-CM | POA: Diagnosis not present

## 2020-11-23 DIAGNOSIS — G8929 Other chronic pain: Secondary | ICD-10-CM | POA: Diagnosis not present

## 2020-11-23 DIAGNOSIS — E782 Mixed hyperlipidemia: Secondary | ICD-10-CM | POA: Diagnosis not present

## 2020-11-23 DIAGNOSIS — I10 Essential (primary) hypertension: Secondary | ICD-10-CM | POA: Diagnosis not present

## 2020-11-23 DIAGNOSIS — K219 Gastro-esophageal reflux disease without esophagitis: Secondary | ICD-10-CM | POA: Diagnosis not present

## 2020-11-23 DIAGNOSIS — M1990 Unspecified osteoarthritis, unspecified site: Secondary | ICD-10-CM | POA: Diagnosis not present

## 2020-11-24 DIAGNOSIS — I10 Essential (primary) hypertension: Secondary | ICD-10-CM | POA: Diagnosis not present

## 2020-11-24 DIAGNOSIS — L209 Atopic dermatitis, unspecified: Secondary | ICD-10-CM | POA: Diagnosis not present

## 2020-11-24 DIAGNOSIS — N183 Chronic kidney disease, stage 3 unspecified: Secondary | ICD-10-CM | POA: Diagnosis not present

## 2020-11-24 DIAGNOSIS — E782 Mixed hyperlipidemia: Secondary | ICD-10-CM | POA: Diagnosis not present

## 2021-01-12 DIAGNOSIS — I10 Essential (primary) hypertension: Secondary | ICD-10-CM | POA: Diagnosis not present

## 2021-01-12 DIAGNOSIS — E782 Mixed hyperlipidemia: Secondary | ICD-10-CM | POA: Diagnosis not present

## 2021-01-12 DIAGNOSIS — K219 Gastro-esophageal reflux disease without esophagitis: Secondary | ICD-10-CM | POA: Diagnosis not present

## 2021-01-12 DIAGNOSIS — N183 Chronic kidney disease, stage 3 unspecified: Secondary | ICD-10-CM | POA: Diagnosis not present

## 2021-01-12 DIAGNOSIS — G8929 Other chronic pain: Secondary | ICD-10-CM | POA: Diagnosis not present

## 2021-01-12 DIAGNOSIS — M1990 Unspecified osteoarthritis, unspecified site: Secondary | ICD-10-CM | POA: Diagnosis not present

## 2021-03-07 DIAGNOSIS — I10 Essential (primary) hypertension: Secondary | ICD-10-CM | POA: Diagnosis not present

## 2021-03-07 DIAGNOSIS — E782 Mixed hyperlipidemia: Secondary | ICD-10-CM | POA: Diagnosis not present

## 2021-03-07 DIAGNOSIS — G8929 Other chronic pain: Secondary | ICD-10-CM | POA: Diagnosis not present

## 2021-03-07 DIAGNOSIS — N183 Chronic kidney disease, stage 3 unspecified: Secondary | ICD-10-CM | POA: Diagnosis not present

## 2021-03-07 DIAGNOSIS — M1990 Unspecified osteoarthritis, unspecified site: Secondary | ICD-10-CM | POA: Diagnosis not present

## 2021-03-07 DIAGNOSIS — K219 Gastro-esophageal reflux disease without esophagitis: Secondary | ICD-10-CM | POA: Diagnosis not present

## 2021-05-14 DIAGNOSIS — I10 Essential (primary) hypertension: Secondary | ICD-10-CM | POA: Diagnosis not present

## 2021-05-14 DIAGNOSIS — E782 Mixed hyperlipidemia: Secondary | ICD-10-CM | POA: Diagnosis not present

## 2021-05-14 DIAGNOSIS — M1990 Unspecified osteoarthritis, unspecified site: Secondary | ICD-10-CM | POA: Diagnosis not present

## 2021-05-14 DIAGNOSIS — N183 Chronic kidney disease, stage 3 unspecified: Secondary | ICD-10-CM | POA: Diagnosis not present

## 2021-05-14 DIAGNOSIS — K219 Gastro-esophageal reflux disease without esophagitis: Secondary | ICD-10-CM | POA: Diagnosis not present

## 2021-05-14 DIAGNOSIS — G8929 Other chronic pain: Secondary | ICD-10-CM | POA: Diagnosis not present

## 2021-05-28 DIAGNOSIS — Z1389 Encounter for screening for other disorder: Secondary | ICD-10-CM | POA: Diagnosis not present

## 2021-05-28 DIAGNOSIS — J301 Allergic rhinitis due to pollen: Secondary | ICD-10-CM | POA: Diagnosis not present

## 2021-05-28 DIAGNOSIS — E782 Mixed hyperlipidemia: Secondary | ICD-10-CM | POA: Diagnosis not present

## 2021-05-28 DIAGNOSIS — Z0000 Encounter for general adult medical examination without abnormal findings: Secondary | ICD-10-CM | POA: Diagnosis not present

## 2021-05-28 DIAGNOSIS — K219 Gastro-esophageal reflux disease without esophagitis: Secondary | ICD-10-CM | POA: Diagnosis not present

## 2021-05-28 DIAGNOSIS — I10 Essential (primary) hypertension: Secondary | ICD-10-CM | POA: Diagnosis not present

## 2021-05-28 DIAGNOSIS — N3281 Overactive bladder: Secondary | ICD-10-CM | POA: Diagnosis not present

## 2021-06-20 DIAGNOSIS — I10 Essential (primary) hypertension: Secondary | ICD-10-CM | POA: Diagnosis not present

## 2021-06-20 DIAGNOSIS — H9313 Tinnitus, bilateral: Secondary | ICD-10-CM | POA: Diagnosis not present

## 2021-06-26 ENCOUNTER — Other Ambulatory Visit: Payer: Self-pay | Admitting: Internal Medicine

## 2021-06-26 DIAGNOSIS — Z1231 Encounter for screening mammogram for malignant neoplasm of breast: Secondary | ICD-10-CM

## 2021-08-09 ENCOUNTER — Ambulatory Visit: Payer: Medicare Other

## 2021-09-12 ENCOUNTER — Ambulatory Visit: Payer: Medicare Other

## 2021-10-12 ENCOUNTER — Ambulatory Visit: Payer: Medicare Other

## 2021-10-19 DIAGNOSIS — Z20822 Contact with and (suspected) exposure to covid-19: Secondary | ICD-10-CM | POA: Diagnosis not present

## 2021-10-23 DIAGNOSIS — I10 Essential (primary) hypertension: Secondary | ICD-10-CM | POA: Diagnosis not present

## 2021-10-23 DIAGNOSIS — E782 Mixed hyperlipidemia: Secondary | ICD-10-CM | POA: Diagnosis not present

## 2021-11-13 DIAGNOSIS — I129 Hypertensive chronic kidney disease with stage 1 through stage 4 chronic kidney disease, or unspecified chronic kidney disease: Secondary | ICD-10-CM | POA: Diagnosis not present

## 2021-11-13 DIAGNOSIS — E559 Vitamin D deficiency, unspecified: Secondary | ICD-10-CM | POA: Diagnosis not present

## 2021-11-13 DIAGNOSIS — I10 Essential (primary) hypertension: Secondary | ICD-10-CM | POA: Diagnosis not present

## 2021-11-13 DIAGNOSIS — N1831 Chronic kidney disease, stage 3a: Secondary | ICD-10-CM | POA: Diagnosis not present

## 2021-11-13 DIAGNOSIS — E785 Hyperlipidemia, unspecified: Secondary | ICD-10-CM | POA: Diagnosis not present

## 2021-11-13 DIAGNOSIS — J309 Allergic rhinitis, unspecified: Secondary | ICD-10-CM | POA: Diagnosis not present

## 2021-11-13 DIAGNOSIS — R944 Abnormal results of kidney function studies: Secondary | ICD-10-CM | POA: Diagnosis not present

## 2021-11-13 DIAGNOSIS — L853 Xerosis cutis: Secondary | ICD-10-CM | POA: Diagnosis not present

## 2021-11-13 DIAGNOSIS — Z0000 Encounter for general adult medical examination without abnormal findings: Secondary | ICD-10-CM | POA: Diagnosis not present

## 2021-11-13 DIAGNOSIS — H9311 Tinnitus, right ear: Secondary | ICD-10-CM | POA: Diagnosis not present

## 2021-11-13 DIAGNOSIS — N3281 Overactive bladder: Secondary | ICD-10-CM | POA: Diagnosis not present

## 2021-11-13 DIAGNOSIS — E782 Mixed hyperlipidemia: Secondary | ICD-10-CM | POA: Diagnosis not present

## 2021-11-26 ENCOUNTER — Other Ambulatory Visit: Payer: Self-pay | Admitting: Family Medicine

## 2021-11-26 DIAGNOSIS — M810 Age-related osteoporosis without current pathological fracture: Secondary | ICD-10-CM

## 2021-11-26 DIAGNOSIS — Z780 Asymptomatic menopausal state: Secondary | ICD-10-CM

## 2021-12-05 DIAGNOSIS — I10 Essential (primary) hypertension: Secondary | ICD-10-CM | POA: Diagnosis not present

## 2021-12-05 DIAGNOSIS — Z86010 Personal history of colonic polyps: Secondary | ICD-10-CM | POA: Diagnosis not present

## 2021-12-05 DIAGNOSIS — E782 Mixed hyperlipidemia: Secondary | ICD-10-CM | POA: Diagnosis not present

## 2021-12-11 DIAGNOSIS — N1831 Chronic kidney disease, stage 3a: Secondary | ICD-10-CM | POA: Diagnosis not present

## 2021-12-18 DIAGNOSIS — Z86010 Personal history of colonic polyps: Secondary | ICD-10-CM | POA: Diagnosis not present

## 2022-01-15 DIAGNOSIS — H9313 Tinnitus, bilateral: Secondary | ICD-10-CM | POA: Diagnosis not present

## 2022-01-15 DIAGNOSIS — H903 Sensorineural hearing loss, bilateral: Secondary | ICD-10-CM | POA: Diagnosis not present

## 2022-04-30 ENCOUNTER — Other Ambulatory Visit: Payer: Medicare Other

## 2022-04-30 ENCOUNTER — Ambulatory Visit: Payer: Medicare Other

## 2022-05-16 DIAGNOSIS — E782 Mixed hyperlipidemia: Secondary | ICD-10-CM | POA: Diagnosis not present

## 2022-05-16 DIAGNOSIS — N189 Chronic kidney disease, unspecified: Secondary | ICD-10-CM | POA: Diagnosis not present

## 2022-05-16 DIAGNOSIS — I129 Hypertensive chronic kidney disease with stage 1 through stage 4 chronic kidney disease, or unspecified chronic kidney disease: Secondary | ICD-10-CM | POA: Diagnosis not present

## 2022-05-16 DIAGNOSIS — N1831 Chronic kidney disease, stage 3a: Secondary | ICD-10-CM | POA: Diagnosis not present

## 2022-05-16 DIAGNOSIS — R748 Abnormal levels of other serum enzymes: Secondary | ICD-10-CM | POA: Diagnosis not present

## 2022-05-16 DIAGNOSIS — N3281 Overactive bladder: Secondary | ICD-10-CM | POA: Diagnosis not present

## 2022-05-16 DIAGNOSIS — L853 Xerosis cutis: Secondary | ICD-10-CM | POA: Diagnosis not present

## 2022-05-16 DIAGNOSIS — I10 Essential (primary) hypertension: Secondary | ICD-10-CM | POA: Diagnosis not present

## 2022-05-16 DIAGNOSIS — R519 Headache, unspecified: Secondary | ICD-10-CM | POA: Diagnosis not present

## 2022-05-16 DIAGNOSIS — E785 Hyperlipidemia, unspecified: Secondary | ICD-10-CM | POA: Diagnosis not present

## 2022-05-16 DIAGNOSIS — R944 Abnormal results of kidney function studies: Secondary | ICD-10-CM | POA: Diagnosis not present

## 2022-05-16 DIAGNOSIS — H9311 Tinnitus, right ear: Secondary | ICD-10-CM | POA: Diagnosis not present

## 2022-05-16 DIAGNOSIS — J309 Allergic rhinitis, unspecified: Secondary | ICD-10-CM | POA: Diagnosis not present

## 2022-05-16 DIAGNOSIS — E559 Vitamin D deficiency, unspecified: Secondary | ICD-10-CM | POA: Diagnosis not present

## 2022-05-16 DIAGNOSIS — N2581 Secondary hyperparathyroidism of renal origin: Secondary | ICD-10-CM | POA: Diagnosis not present

## 2022-05-16 DIAGNOSIS — Z1231 Encounter for screening mammogram for malignant neoplasm of breast: Secondary | ICD-10-CM | POA: Diagnosis not present

## 2022-05-17 ENCOUNTER — Ambulatory Visit
Admission: RE | Admit: 2022-05-17 | Discharge: 2022-05-17 | Disposition: A | Discharge status: Home / Self Care | Payer: Medicare Other | Source: Ambulatory Visit | Attending: Internal Medicine | Admitting: Internal Medicine

## 2022-05-17 DIAGNOSIS — Z1231 Encounter for screening mammogram for malignant neoplasm of breast: Secondary | ICD-10-CM

## 2022-09-10 DIAGNOSIS — N3000 Acute cystitis without hematuria: Secondary | ICD-10-CM | POA: Diagnosis not present

## 2022-09-10 DIAGNOSIS — R350 Frequency of micturition: Secondary | ICD-10-CM | POA: Diagnosis not present

## 2022-09-18 DIAGNOSIS — R531 Weakness: Secondary | ICD-10-CM | POA: Diagnosis not present

## 2022-09-18 DIAGNOSIS — I10 Essential (primary) hypertension: Secondary | ICD-10-CM | POA: Diagnosis not present

## 2022-09-18 DIAGNOSIS — Z79899 Other long term (current) drug therapy: Secondary | ICD-10-CM | POA: Diagnosis not present

## 2022-09-18 DIAGNOSIS — R32 Unspecified urinary incontinence: Secondary | ICD-10-CM | POA: Diagnosis not present

## 2022-09-19 DIAGNOSIS — N3281 Overactive bladder: Secondary | ICD-10-CM | POA: Diagnosis not present

## 2022-09-19 DIAGNOSIS — R32 Unspecified urinary incontinence: Secondary | ICD-10-CM | POA: Diagnosis not present

## 2022-10-07 ENCOUNTER — Other Ambulatory Visit: Payer: Self-pay | Admitting: Family Medicine

## 2022-10-07 DIAGNOSIS — M810 Age-related osteoporosis without current pathological fracture: Secondary | ICD-10-CM

## 2022-10-07 DIAGNOSIS — Z780 Asymptomatic menopausal state: Secondary | ICD-10-CM

## 2022-10-07 DIAGNOSIS — Z13820 Encounter for screening for osteoporosis: Secondary | ICD-10-CM

## 2022-10-10 ENCOUNTER — Ambulatory Visit
Admission: RE | Admit: 2022-10-10 | Discharge: 2022-10-10 | Disposition: A | Discharge status: Home / Self Care | Payer: Medicare Other | Source: Ambulatory Visit | Attending: Family Medicine | Admitting: Family Medicine

## 2022-10-10 DIAGNOSIS — Z780 Asymptomatic menopausal state: Secondary | ICD-10-CM | POA: Diagnosis not present

## 2022-10-10 DIAGNOSIS — Z13820 Encounter for screening for osteoporosis: Secondary | ICD-10-CM

## 2022-10-10 DIAGNOSIS — M85851 Other specified disorders of bone density and structure, right thigh: Secondary | ICD-10-CM | POA: Diagnosis not present

## 2024-01-06 ENCOUNTER — Other Ambulatory Visit (HOSPITAL_COMMUNITY): Payer: Self-pay | Admitting: Adult Health

## 2024-01-06 ENCOUNTER — Other Ambulatory Visit: Payer: Self-pay | Admitting: Adult Health

## 2024-01-06 DIAGNOSIS — R9431 Abnormal electrocardiogram [ECG] [EKG]: Secondary | ICD-10-CM

## 2024-01-06 DIAGNOSIS — Z1231 Encounter for screening mammogram for malignant neoplasm of breast: Secondary | ICD-10-CM

## 2024-01-29 ENCOUNTER — Ambulatory Visit (HOSPITAL_COMMUNITY): Attending: Cardiology

## 2024-01-29 DIAGNOSIS — R9431 Abnormal electrocardiogram [ECG] [EKG]: Secondary | ICD-10-CM | POA: Insufficient documentation

## 2024-01-29 LAB — ECHOCARDIOGRAM COMPLETE
Area-P 1/2: 3.33 cm2
S' Lateral: 2.7 cm

## 2024-01-30 ENCOUNTER — Ambulatory Visit
Admission: RE | Admit: 2024-01-30 | Discharge: 2024-01-30 | Disposition: A | Discharge status: Home / Self Care | Source: Ambulatory Visit | Attending: Adult Health | Admitting: Adult Health

## 2024-01-30 ENCOUNTER — Other Ambulatory Visit (HOSPITAL_COMMUNITY)

## 2024-01-30 DIAGNOSIS — Z1231 Encounter for screening mammogram for malignant neoplasm of breast: Secondary | ICD-10-CM

## 2024-10-27 ENCOUNTER — Emergency Department (HOSPITAL_COMMUNITY)

## 2024-10-27 ENCOUNTER — Emergency Department (HOSPITAL_COMMUNITY): Admission: EM | Admit: 2024-10-27 | Discharge: 2024-10-27 | Disposition: A | Discharge status: Home / Self Care

## 2024-10-27 ENCOUNTER — Other Ambulatory Visit: Payer: Self-pay

## 2024-10-27 DIAGNOSIS — M542 Cervicalgia: Secondary | ICD-10-CM | POA: Insufficient documentation

## 2024-10-27 DIAGNOSIS — N189 Chronic kidney disease, unspecified: Secondary | ICD-10-CM | POA: Insufficient documentation

## 2024-10-27 DIAGNOSIS — Z79899 Other long term (current) drug therapy: Secondary | ICD-10-CM | POA: Insufficient documentation

## 2024-10-27 DIAGNOSIS — R55 Syncope and collapse: Secondary | ICD-10-CM | POA: Insufficient documentation

## 2024-10-27 DIAGNOSIS — W19XXXA Unspecified fall, initial encounter: Secondary | ICD-10-CM

## 2024-10-27 DIAGNOSIS — W1812XA Fall from or off toilet with subsequent striking against object, initial encounter: Secondary | ICD-10-CM | POA: Insufficient documentation

## 2024-10-27 DIAGNOSIS — Y92002 Bathroom of unspecified non-institutional (private) residence single-family (private) house as the place of occurrence of the external cause: Secondary | ICD-10-CM | POA: Diagnosis not present

## 2024-10-27 DIAGNOSIS — S0181XA Laceration without foreign body of other part of head, initial encounter: Secondary | ICD-10-CM | POA: Diagnosis not present

## 2024-10-27 DIAGNOSIS — S0990XA Unspecified injury of head, initial encounter: Secondary | ICD-10-CM | POA: Diagnosis present

## 2024-10-27 MED ORDER — LIDOCAINE-EPINEPHRINE (PF) 2 %-1:200000 IJ SOLN
10.0000 mL | Freq: Once | INTRAMUSCULAR | Status: DC
  Filled 2024-10-27: qty 20

## 2024-10-27 MED ORDER — FENTANYL CITRATE (PF) 50 MCG/ML IJ SOSY
50.0000 ug | PREFILLED_SYRINGE | Freq: Once | INTRAMUSCULAR | Status: AC
  Administered 2024-10-27: 50 ug via INTRAVENOUS
  Filled 2024-10-27: qty 1

## 2024-10-27 MED ORDER — ONDANSETRON HCL 4 MG/2ML IJ SOLN
4.0000 mg | Freq: Once | INTRAMUSCULAR | Status: AC
  Administered 2024-10-27: 4 mg via INTRAVENOUS
  Filled 2024-10-27: qty 2

## 2024-10-27 NOTE — ED Provider Notes (Signed)
 Signout from [employer], PA-C at shift change. Briefly, patient presents for a fall off of the toilet earlier today.  Patient notes she was standing up when she fell forward and hit her head on the wall.  Believes she may have lost consciousness.  Patient on ground for 3 minutes before she called EMS.  Endorses neck pain and mild headache but no other symptoms.   Plan: Plan is to evaluate CT imaging and disposition per imaging.  Laceration on forehead will need repair.   4:11 PM Reassessment performed. Patient appears resting comfortably in the bed.  C-collar removed after negative CT cervical spine.  Laceration is approximately 3 cm vertically in the center of the forehead.  Labs and imaging personally reviewed and interpreted including: CT of the head and neck negative for acute process.  X-ray of the chest and thoracic spine unremarkable today.   Reviewed additional pertinent lab work and imaging with patient at bedside including: Imaging reviewed with patient and she verbalized understanding.   Most current vital signs reviewed and are as follows: BP (!) 136/56 (BP Location: Right Arm)   Pulse 70   Temp 97.6 F (36.4 C) (Oral)   Resp 16   SpO2 93%   .Laceration Repair  Date/Time: 10/27/2024 3:31 PM  Performed by: Neysa Thersia RAMAN, PA-C Authorized by: Neysa Thersia RAMAN, PA-C   Consent:    Consent obtained:  Verbal   Consent given by:  Patient   Risks discussed:  Infection Anesthesia:    Anesthesia method:  Local infiltration   Local anesthetic:  Lidocaine  2% WITH epi Laceration details:    Location:  Face   Face location:  Forehead   Length (cm):  3   Depth (mm):  1 Pre-procedure details:    Preparation:  Patient was prepped and draped in usual sterile fashion Exploration:    Hemostasis achieved with:  Direct pressure Treatment:    Area cleansed with:  Povidone-iodine   Amount of cleaning:  Standard   Irrigation solution:  Sterile saline   Irrigation volume:  20cc    Irrigation method:  Syringe Skin repair:    Repair method:  Sutures   Suture size:  4-0   Suture material:  Prolene   Suture technique:  Simple interrupted   Number of sutures:  5 Approximation:    Approximation:  Close Repair type:    Repair type:  Simple Post-procedure details:    Dressing:  Adhesive bandage   Procedure completion:  Tolerated well, no immediate complications      Plan: Laceration repaired and pain improving fentanyl .  No current nausea.  Mild neck pain still.  Otherwise well-appearing and stable for discharge home.   Home treatment: Symptomatic care including Tylenol  for pain discussed.  Wound care instructions provided.   Return and follow-up instructions: Encouraged return to ED with worsening symptoms including new falls, severe uncontrolled headache, syncopal episode, signs of infection around the wound.. Encouraged patient to follow-up with their provider in 7 days for reevaluation and suture removal. Patient verbalized understanding and agreed with plan.        Neysa Thersia RAMAN, PA-C 10/27/24 1611    Mannie Pac T, DO 10/28/24 [phone]

## 2024-10-27 NOTE — ED Provider Notes (Signed)
 " [place] [facility] AT [facility] Provider Note   CSN: [id number] Arrival date & time: 10/27/24  1350     Patient presents with: Fall and Loss of Consciousness   Caroline Best is a 73 y.o. female with a history of CKD, who presents to the ED BIBEMS for evaluation after a fall at home.  Patient states that she was sitting on the toilet when she tried to stand and had a syncopal episode where she fell forward from a sitting position onto the bathroom floor.  Patient reports hitting her head with a positive LOC.  Patient denies anticoagulation use.  Patient states that at she is unsure how long she was unconscious, however she estimates only a few minutes.  Patient lives at home and is fully ambulatory without assistance at baseline.  Patient is in a c-collar on arrival.  The patient states she is currently experiencing headache, pain to her neck, and endorses pain to a laceration to her forehead that has been wrapped by EMS. The patient is in no acute distress.     Fall  Loss of Consciousness      Prior to Admission medications  Medication Sig Start Date End Date Taking? Authorizing Provider  azelastine (ASTELIN) 0.1 % nasal spray Place into both nostrils 2 (two) times daily. Use in each nostril as directed    [provider]  hydrochlorothiazide (HYDRODIURIL) 12.5 MG tablet Take 12.5 mg by mouth daily.    [provider]  HYDROcodone -acetaminophen  (NORCO) 5-325 MG tablet 1-2 tabs po q6 hours prn pain 01/22/18   Kuzma, Kevin, MD  omeprazole (PRILOSEC) 40 MG capsule Take 40 mg by mouth daily.    [provider]  simvastatin (ZOCOR) 20 MG tablet Take 20 mg by mouth daily.    [provider]  tolterodine (DETROL) 1 MG tablet Take 1 mg by mouth 2 (two) times daily.    [provider]    Allergies: Lisinopril    Review of Systems  Cardiovascular:  Positive for syncope.    Updated Vital Signs BP (!) 136/56 (BP  Location: Right Arm)   Pulse 70   Temp 97.6 F (36.4 C) (Oral)   Resp 16   SpO2 93%   Physical Exam Vitals and nursing note reviewed.  Constitutional:      General: She is not in acute distress.    Appearance: Normal appearance. She is not toxic-appearing.  HENT:     Head: Normocephalic. Contusion and laceration present.     Jaw: There is normal jaw occlusion. No tenderness, swelling, pain on movement or malocclusion.      Comments: Patient has an approximate 2 inch laceration to forehead, just superior to right brow ridge.  Bleeding controlled.  There is no crepitus or obvious underlying deformity palpated.    Right Ear: Tympanic membrane and ear canal normal.     Left Ear: Tympanic membrane and ear canal normal.     Nose: Nose normal. No nasal deformity or signs of injury.     Mouth/Throat:     Mouth: Mucous membranes are moist.  Eyes:     General: Lids are normal. Vision grossly intact.     Extraocular Movements: Extraocular movements intact.     Conjunctiva/sclera: Conjunctivae normal.     Pupils: Pupils are equal, round, and reactive to light.  Cardiovascular:     Rate and Rhythm: Normal rate and regular rhythm.     Pulses: Normal pulses.  Radial pulses are 2+ on the right side and 2+ on the left side.  Pulmonary:     Effort: Pulmonary effort is normal. No respiratory distress.     Breath sounds: Normal breath sounds.  Chest:     Chest wall: No deformity, tenderness or crepitus.  Abdominal:     General: Abdomen is flat.     Palpations: Abdomen is soft.     Tenderness: There is no abdominal tenderness.  Musculoskeletal:        General: Normal range of motion.     Cervical back: Muscular tenderness present.     Comments: Upper and lower extremities bilaterally unremarkable.  No pain or obvious signs of trauma. ROM normal.  Skin:    General: Skin is warm and dry.     Capillary Refill: Capillary refill takes less than 2 seconds.  Neurological:     General: No  focal deficit present.     Mental Status: She is alert. Mental status is at baseline.     Comments: Patient alert and oriented.  Speech clear and appropriate.  No aphasia or dysarthria.   Cranial nerves III through XII intact: Motor strength 5-5 in all extremities with normal tone and no pronator drift.   Sensation intact to light touch in upper and lower extremities bilaterally.  Coordination normal with intact finger-nose. No focal neurologic deficits appreciated.   Psychiatric:        Mood and Affect: Mood normal.     (all labs ordered are listed, but only abnormal results are displayed) Labs Reviewed - No data to display  EKG: None  Radiology: No results found.   Procedures   Medications Ordered in the ED  fentaNYL  (SUBLIMAZE ) injection 50 mcg (has no administration in time range)  ondansetron  (ZOFRAN ) injection 4 mg (has no administration in time range)                                 Medical Decision Making Amount and/or Complexity of Data Reviewed Radiology: ordered. ECG/medicine tests:  Decision-making details documented in ED Course.  Risk Prescription drug management.   Patient presents to the ED for: Fall This involves an extensive number of treatment options and is a complaint that carries with it a high risk of complications  Differential diagnosis includes: Traumatic etiology Minor MSK etiology Underlying metabolic or cardiac etiology Co-morbid conditions: None  Clinical Course as of 10/27/24 1511  Wed Oct 27, 2024  1412 Temp: 97.6 F (36.4 C) Vital stable, patient in no acute distress [ML]  1413 EKG 12-Lead Normal sinus [ML]  1510 Patient to receive and ondansetron  for symptomatic relief [ML]  1511 Imaging pending at handoff - [ML]    Clinical Course User Index [ML] Willma Duwaine CROME, PA    Data Reviewed / Actions Taken: Imaging ordered and pending at handoff -  EKG ordered/reviewed with my independent interpretation in ED course above. The  patient did not require continuous cardiac monitoring during the ED stay.  Management / Treatments: See ED course above for medications, treatments administered, and clinical rationale.   I have reviewed the patients home medicines and have made adjustments as needed  Test Considered/Diagnostic tools:  Additional diagnostic testing such as laboratory studies were considered based on the patients presenting symptoms and initial clinical assessment of fall with positive loss of consciousness - this was relayed at patient handoff -   ED Course / Reassessments: Problem List:  Fall with injury 73 year old female presented for evaluation after a fall at home. Initial assessment included history, physical exam, and review of prior medical records.  Given complaint, imaging was obtained to further evaluate patient's injuries.  Pain was addressed with medication management.  Vitals remained stable and patient in no acute distress.  At the time of handoff imaging and further laboratory studies pending.  If imaging is unremarkable, patient should be cleared for discharge after final reassessment and disposition determination.   Disposition: Disposition: Discharge pending - handoff to provider Young PA-C Rationale for disposition: Results pending-to further evaluate patient after all studies result. The disposition plan and rationale were discussed with the patient at the bedside, all questions were addressed, and the patient demonstrated understanding.  This note was produced using [employer]. While I have reviewed and verified all clinical information, transcription errors may remain.      Final diagnoses:  None    ED Discharge Orders     None          Willma Duwaine CROME, [place] 10/27/24 1522    Neysa Caron PARAS, DO 10/27/24 1536  "

## 2024-10-27 NOTE — Discharge Instructions (Signed)
 Keep stitches clean and dry.  May take Tylenol  every 6 hours as needed for pain.  Will be sore for a few days.  Follow-up in 7 to 10 days to have sutures removed.  May also follow-up with PCP in 1 week for any continued symptoms.  Return to ED if any symptoms worsen including severe controllable headache, new syncopal episode, uncontrollable nausea/vomiting, signs of infection around the wound.

## 2024-10-27 NOTE — ED Triage Notes (Signed)
 BIBA from home- pt had LOC while sitting on toilet, fell forward and hit head. Pt has forehead contusion, bleeding controlled. Pt has c/o neck pain, C-collar in place. No thinners. 97 cbg 96% r/a 128/72 bp 78 hr
